# Patient Record
Sex: Female | Born: 1975 | Race: Black or African American | Hispanic: No | Marital: Married
Health system: Southern US, Community
[De-identification: ages and names within clinical notes are randomized; demographics above are authoritative.]

---

## 2000-06-23 ENCOUNTER — Encounter (INDEPENDENT_AMBULATORY_CARE_PROVIDER_SITE_OTHER): Payer: Self-pay | Admitting: Specialist

## 2000-06-23 ENCOUNTER — Other Ambulatory Visit: Admission: RE | Admit: 2000-06-23 | Discharge: 2000-06-23 | Payer: Self-pay | Admitting: *Deleted

## 2001-03-14 ENCOUNTER — Other Ambulatory Visit: Admission: RE | Admit: 2001-03-14 | Discharge: 2001-03-14 | Payer: Self-pay | Admitting: Obstetrics and Gynecology

## 2007-05-17 ENCOUNTER — Other Ambulatory Visit: Admission: RE | Admit: 2007-05-17 | Discharge: 2007-05-17 | Payer: Self-pay | Admitting: Family Medicine

## 2007-09-05 ENCOUNTER — Encounter (INDEPENDENT_AMBULATORY_CARE_PROVIDER_SITE_OTHER): Payer: Self-pay | Admitting: Family Medicine

## 2007-09-05 ENCOUNTER — Ambulatory Visit: Payer: Self-pay | Admitting: Vascular Surgery

## 2007-09-05 ENCOUNTER — Ambulatory Visit (HOSPITAL_COMMUNITY): Admission: RE | Admit: 2007-09-05 | Discharge: 2007-09-05 | Payer: Self-pay | Admitting: Family Medicine

## 2008-05-23 ENCOUNTER — Other Ambulatory Visit: Admission: RE | Admit: 2008-05-23 | Discharge: 2008-05-23 | Payer: Self-pay | Admitting: Family Medicine

## 2009-01-21 ENCOUNTER — Emergency Department (HOSPITAL_COMMUNITY): Admission: EM | Admit: 2009-01-21 | Discharge: 2009-01-21 | Payer: Self-pay | Admitting: Emergency Medicine

## 2009-01-24 ENCOUNTER — Encounter: Admission: RE | Admit: 2009-01-24 | Discharge: 2009-01-24 | Payer: Self-pay | Admitting: Family Medicine

## 2009-08-04 ENCOUNTER — Ambulatory Visit (HOSPITAL_COMMUNITY): Admission: RE | Admit: 2009-08-04 | Discharge: 2009-08-04 | Payer: Self-pay | Admitting: Obstetrics & Gynecology

## 2009-09-24 ENCOUNTER — Inpatient Hospital Stay (HOSPITAL_COMMUNITY): Admission: AD | Admit: 2009-09-24 | Discharge: 2009-09-25 | Payer: Self-pay | Admitting: Obstetrics & Gynecology

## 2009-09-24 ENCOUNTER — Ambulatory Visit: Payer: Self-pay | Admitting: Physician Assistant

## 2010-01-01 ENCOUNTER — Inpatient Hospital Stay (HOSPITAL_COMMUNITY): Admission: AD | Admit: 2010-01-01 | Discharge: 2010-01-01 | Payer: Self-pay | Admitting: Obstetrics and Gynecology

## 2010-01-02 ENCOUNTER — Inpatient Hospital Stay (HOSPITAL_COMMUNITY): Admission: AD | Admit: 2010-01-02 | Discharge: 2010-01-05 | Payer: Self-pay | Admitting: Obstetrics and Gynecology

## 2010-01-02 ENCOUNTER — Inpatient Hospital Stay (HOSPITAL_COMMUNITY): Admission: AD | Admit: 2010-01-02 | Discharge: 2010-01-02 | Payer: Self-pay | Admitting: Obstetrics and Gynecology

## 2010-06-17 LAB — CBC
HCT: 39.8 % (ref 36.0–46.0)
Hemoglobin: 11.4 g/dL — ABNORMAL LOW (ref 12.0–15.0)
MCH: 31.3 pg (ref 26.0–34.0)
MCHC: 34.2 g/dL (ref 30.0–36.0)
MCV: 90 fL (ref 78.0–100.0)
Platelets: 146 10*3/uL — ABNORMAL LOW (ref 150–400)
Platelets: 182 10*3/uL (ref 150–400)
RBC: 3.64 MIL/uL — ABNORMAL LOW (ref 3.87–5.11)
RBC: 4.42 MIL/uL (ref 3.87–5.11)

## 2010-06-22 LAB — CBC
Hemoglobin: 13.5 g/dL (ref 12.0–15.0)
MCHC: 34.2 g/dL (ref 30.0–36.0)
Platelets: 228 10*3/uL (ref 150–400)
RDW: 13.4 % (ref 11.5–15.5)
WBC: 8.7 10*3/uL (ref 4.0–10.5)

## 2010-07-08 LAB — D-DIMER, QUANTITATIVE

## 2012-11-16 ENCOUNTER — Other Ambulatory Visit (HOSPITAL_COMMUNITY): Payer: Self-pay | Admitting: Obstetrics & Gynecology

## 2012-11-16 DIAGNOSIS — N971 Female infertility of tubal origin: Secondary | ICD-10-CM

## 2012-11-22 ENCOUNTER — Ambulatory Visit (HOSPITAL_COMMUNITY)
Admission: RE | Admit: 2012-11-22 | Discharge: 2012-11-22 | Disposition: A | Discharge status: Home / Self Care | Payer: BC Managed Care – PPO | Source: Ambulatory Visit | Attending: Obstetrics & Gynecology | Admitting: Obstetrics & Gynecology

## 2012-11-22 DIAGNOSIS — N971 Female infertility of tubal origin: Secondary | ICD-10-CM

## 2012-11-22 DIAGNOSIS — Z3049 Encounter for surveillance of other contraceptives: Secondary | ICD-10-CM | POA: Insufficient documentation

## 2012-11-22 MED ORDER — IOHEXOL 300 MG/ML  SOLN
20.0000 mL | Freq: Once | INTRAMUSCULAR | Status: AC | PRN
  Administered 2012-11-22: 20 mL

## 2014-03-09 IMAGING — RF DG HYSTEROGRAM
7 series · 7 of 7 positions shown · IV contrast (omnipaque)
Comparison: none

CLINICAL DATA: Post Essure evaluation

HYSTEROSALPINGOGRAM
TECHNIQUE: Following cleansing of the cervix and vagina with
Betadine solution, a hysterosalpingogram was performed using a 5-
French hysterosalpingogram catheter and Omnipaque 300 contrast.
The patient tolerated the examination without difficulty.
Fluoroscopy time: 36 seconds

[Series 1: run · 1 of 1 slices shown (1 of 7)]
[im 1/1]
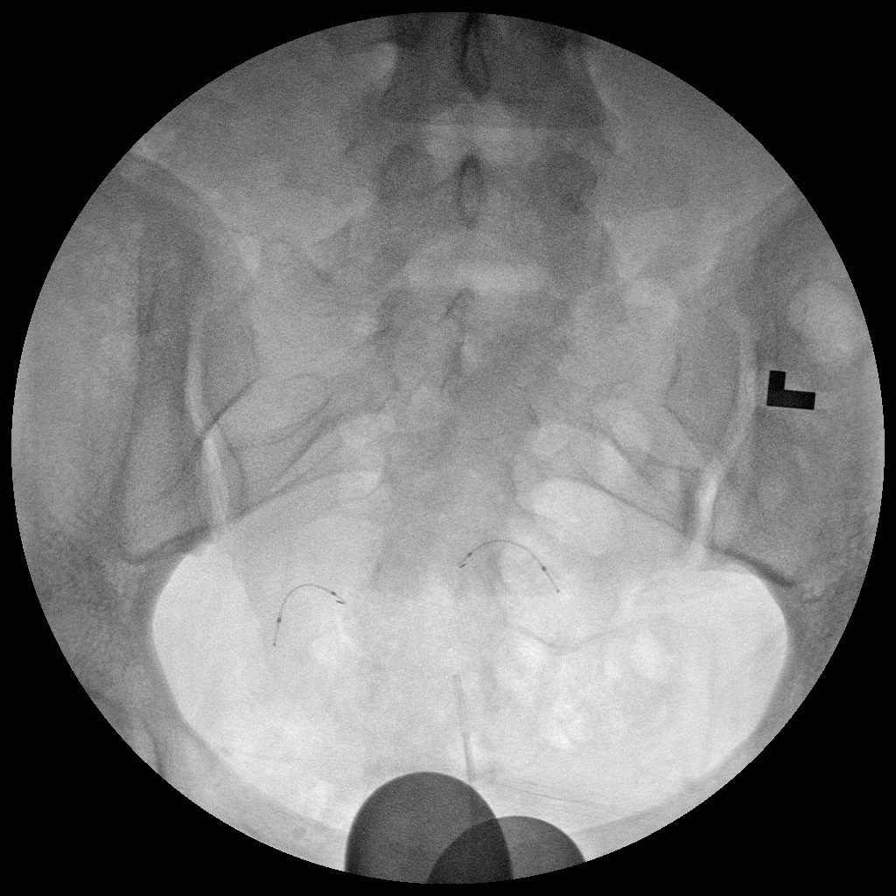

[Series 2: run · 1 of 1 slices shown (2 of 7)]
[im 1/1]
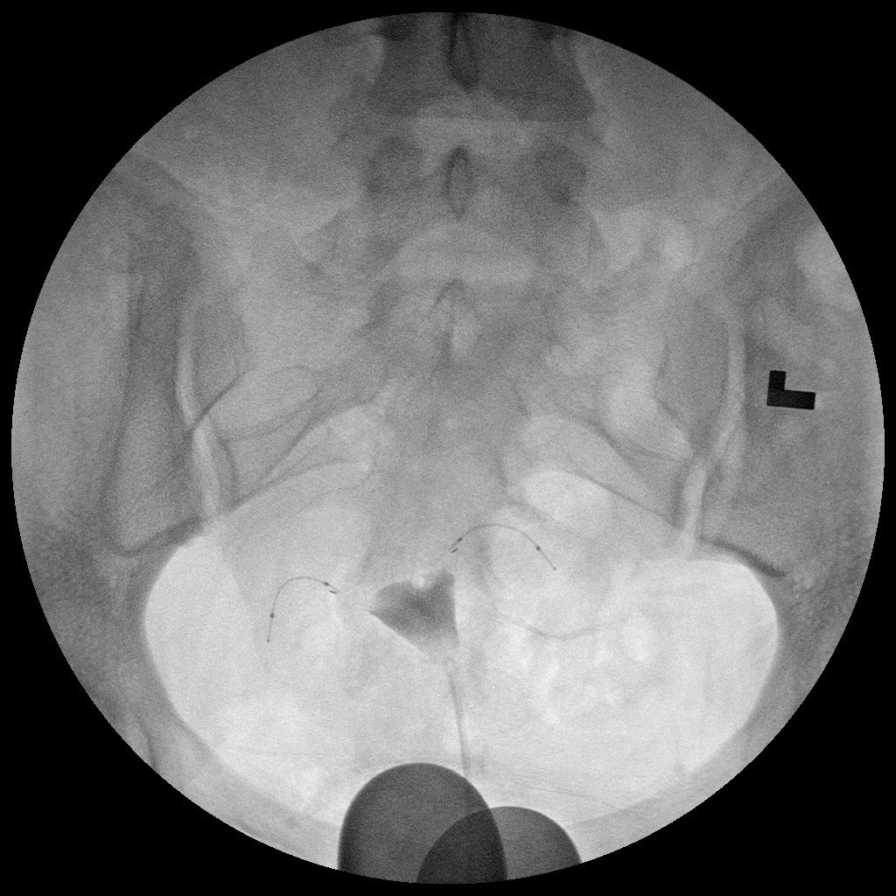

[Series 3: run · 1 of 1 slices shown (3 of 7)]
[im 1/1]
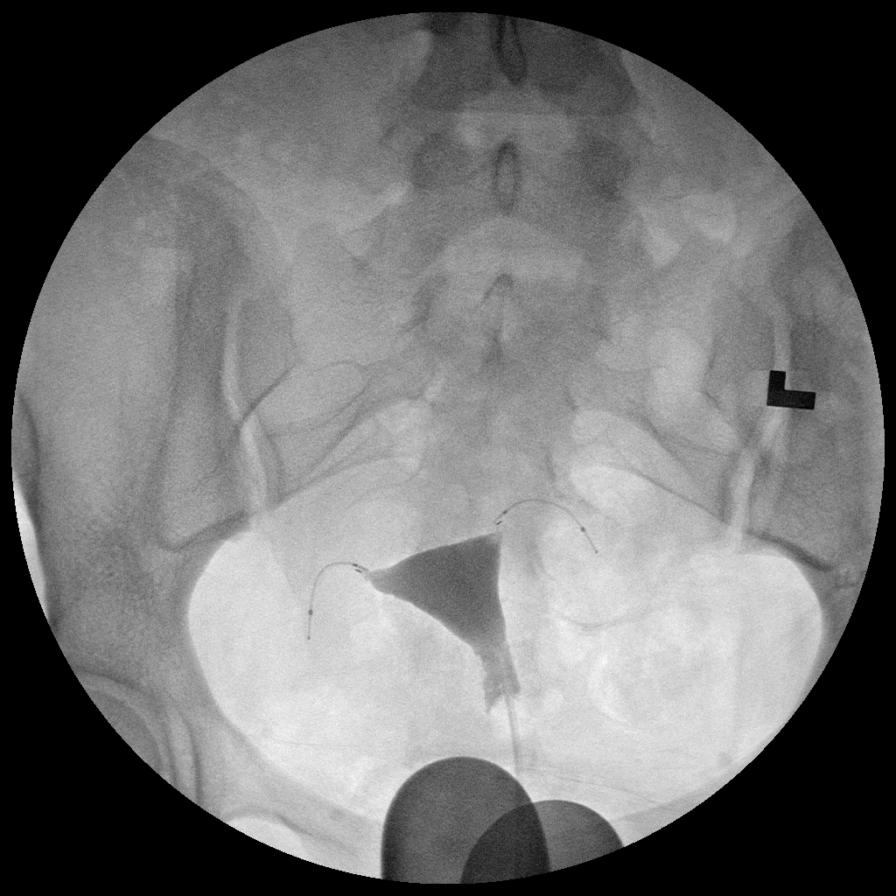

[Series 4: run · 1 of 1 slices shown (4 of 7)]
[im 1/1]
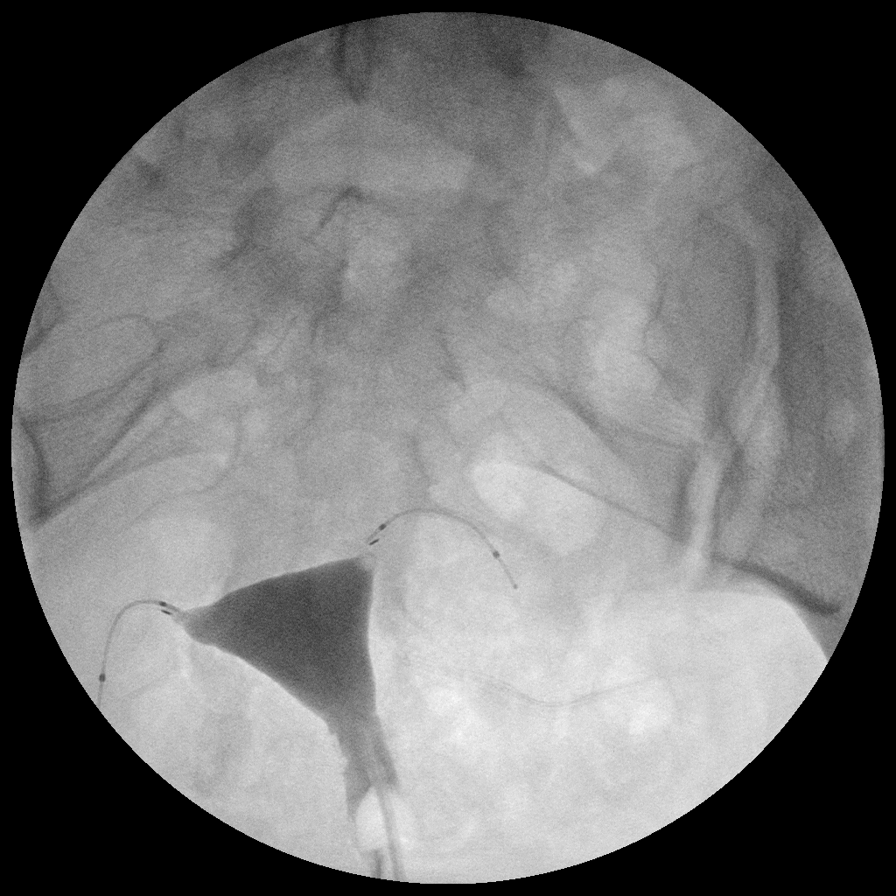

[Series 5: run · 1 of 1 slices shown (5 of 7)]
[im 1/1]
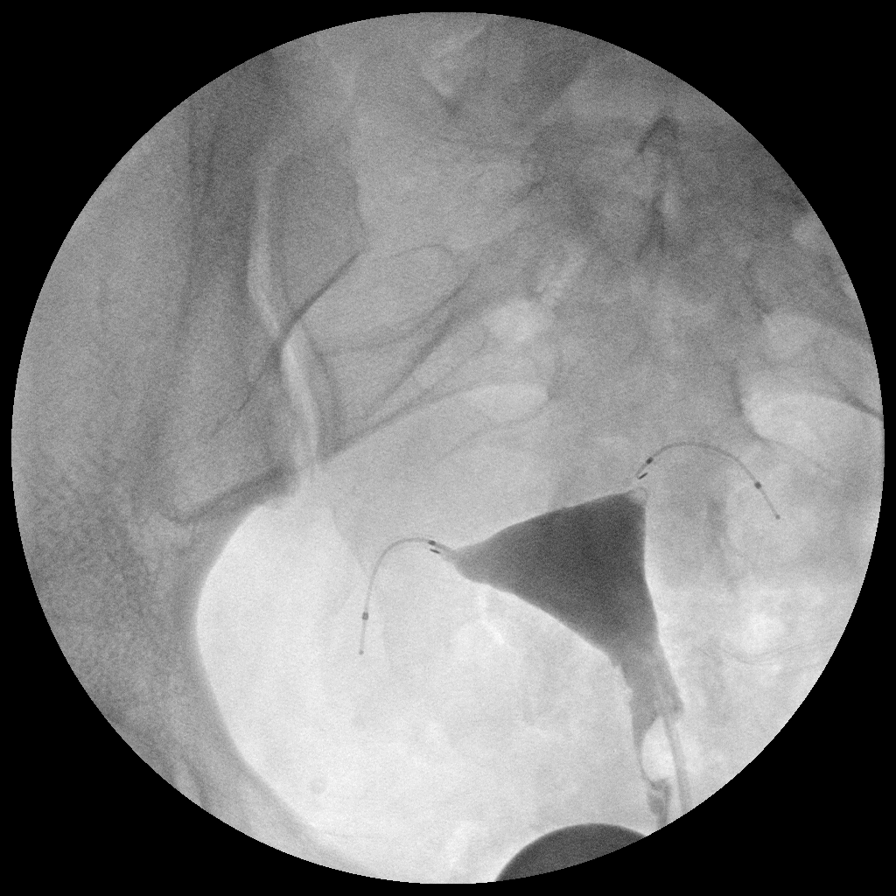

[Series 6: run · 1 of 1 slices shown (6 of 7)]
[im 1/1]
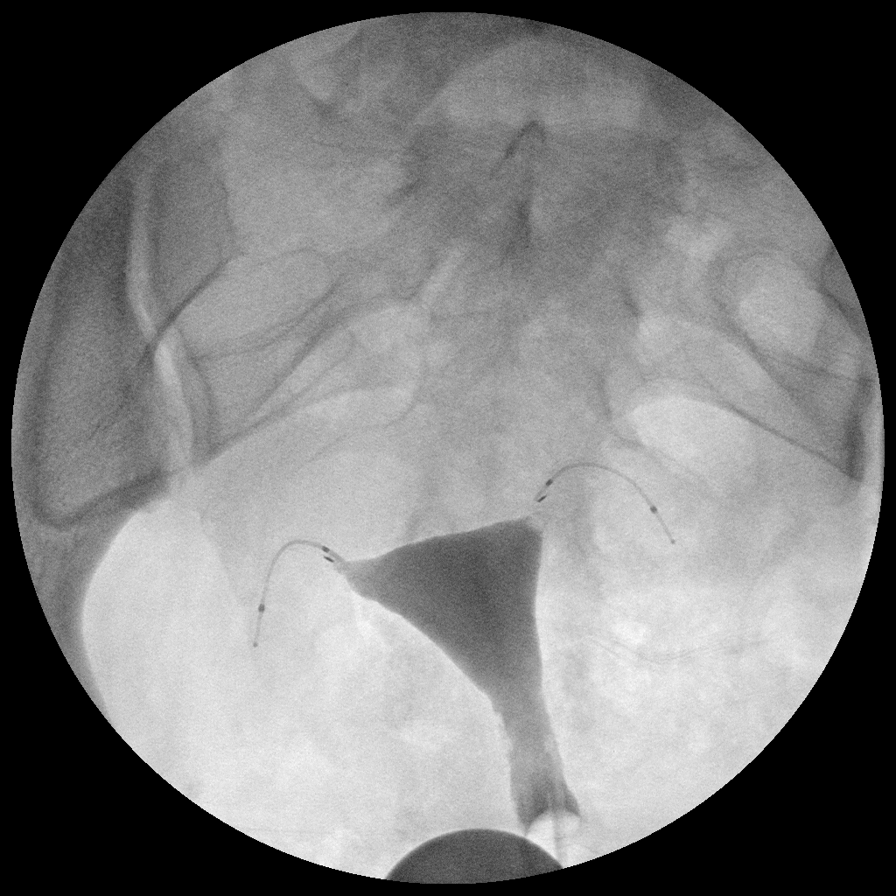

[Series 7: run · 1 of 1 slices shown (7 of 7)]
[im 1/1]
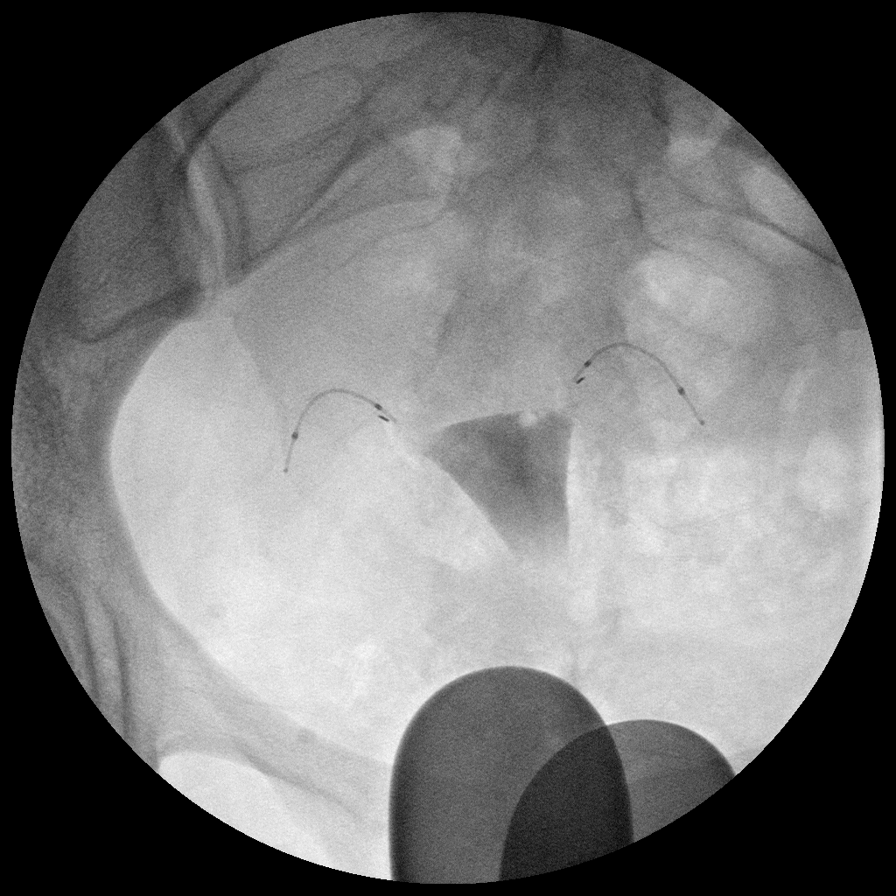

[7 of 7 positions shown; findings below may reference images not displayed]

FINDINGS: Both Essure implants are positioned appropriately with
the proximal portion of the inner coil at the cornual tubal
junction.  No evidence for contrast beyond the level of the cornual-
tubal junction is seen on either side compatible with bilateral
occlusion.

A normal endometrial morphology is seen.
IMPRESSION: Bilateral tubal occlusion and appropriate Essure implant position
confirmed.

## 2021-08-05 ENCOUNTER — Telehealth: Payer: Self-pay

## 2021-08-05 NOTE — Telephone Encounter (Signed)
Erroneous encounter

## 2023-10-25 ENCOUNTER — Other Ambulatory Visit: Payer: Self-pay | Admitting: Medical Genetics

## 2023-10-31 ENCOUNTER — Other Ambulatory Visit (HOSPITAL_COMMUNITY)
Admission: RE | Admit: 2023-10-31 | Discharge: 2023-10-31 | Disposition: A | Discharge status: Home / Self Care | Payer: Self-pay | Source: Ambulatory Visit | Attending: Nurse Practitioner | Admitting: Nurse Practitioner

## 2023-11-11 LAB — GENECONNECT MOLECULAR SCREEN: Genetic Analysis Overall Interpretation: NEGATIVE
